# Patient Record
Sex: Male | Born: 1980 | Race: White | Hispanic: No | Marital: Married | Smoking: Former smoker
Health system: Southern US, Community
[De-identification: ages and names within clinical notes are randomized; demographics above are authoritative.]

## PROBLEM LIST (undated history)

## (undated) DIAGNOSIS — J45909 Unspecified asthma, uncomplicated: Secondary | ICD-10-CM

## (undated) HISTORY — PX: LATERAL COLLATERAL LIGAMENT REPAIR, KNEE: SHX1957

---
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
## 2003-09-30 ENCOUNTER — Emergency Department (HOSPITAL_COMMUNITY): Admission: EM | Admit: 2003-09-30 | Discharge: 2003-09-30 | Payer: Self-pay | Admitting: Emergency Medicine

## 2003-10-08 ENCOUNTER — Emergency Department (HOSPITAL_COMMUNITY): Admission: EM | Admit: 2003-10-08 | Discharge: 2003-10-08 | Payer: Self-pay | Admitting: *Deleted

## 2003-11-05 ENCOUNTER — Emergency Department (HOSPITAL_COMMUNITY): Admission: EM | Admit: 2003-11-05 | Discharge: 2003-11-05 | Payer: Self-pay | Admitting: Emergency Medicine

## 2004-05-19 ENCOUNTER — Emergency Department (HOSPITAL_COMMUNITY): Admission: EM | Admit: 2004-05-19 | Discharge: 2004-05-19 | Payer: Self-pay | Admitting: Emergency Medicine

## 2005-08-16 ENCOUNTER — Emergency Department (HOSPITAL_COMMUNITY): Admission: EM | Admit: 2005-08-16 | Discharge: 2005-08-17 | Payer: Self-pay | Admitting: Emergency Medicine

## 2005-12-14 ENCOUNTER — Emergency Department (HOSPITAL_COMMUNITY): Admission: EM | Admit: 2005-12-14 | Discharge: 2005-12-15 | Payer: Self-pay | Admitting: Emergency Medicine

## 2006-11-22 ENCOUNTER — Emergency Department (HOSPITAL_COMMUNITY): Admission: EM | Admit: 2006-11-22 | Discharge: 2006-11-22 | Payer: Self-pay | Admitting: Emergency Medicine

## 2007-03-07 ENCOUNTER — Emergency Department (HOSPITAL_COMMUNITY): Admission: EM | Admit: 2007-03-07 | Discharge: 2007-03-08 | Payer: Self-pay | Admitting: Emergency Medicine

## 2007-03-09 ENCOUNTER — Ambulatory Visit: Payer: Self-pay | Admitting: Orthopedic Surgery

## 2007-03-23 ENCOUNTER — Ambulatory Visit: Payer: Self-pay | Admitting: Orthopedic Surgery

## 2007-03-23 DIAGNOSIS — S62639A Displaced fracture of distal phalanx of unspecified finger, initial encounter for closed fracture: Secondary | ICD-10-CM | POA: Insufficient documentation

## 2007-03-23 DIAGNOSIS — J45909 Unspecified asthma, uncomplicated: Secondary | ICD-10-CM | POA: Insufficient documentation

## 2007-04-01 ENCOUNTER — Emergency Department (HOSPITAL_COMMUNITY): Admission: EM | Admit: 2007-04-01 | Discharge: 2007-04-01 | Payer: Self-pay | Admitting: Emergency Medicine

## 2007-04-06 ENCOUNTER — Ambulatory Visit: Payer: Self-pay | Admitting: Orthopedic Surgery

## 2007-06-04 ENCOUNTER — Emergency Department (HOSPITAL_COMMUNITY): Admission: EM | Admit: 2007-06-04 | Discharge: 2007-06-04 | Payer: Self-pay | Admitting: Emergency Medicine

## 2007-06-27 ENCOUNTER — Ambulatory Visit: Payer: Self-pay | Admitting: Cardiology

## 2008-04-11 ENCOUNTER — Emergency Department (HOSPITAL_COMMUNITY): Admission: EM | Admit: 2008-04-11 | Discharge: 2008-04-11 | Payer: Self-pay | Admitting: Emergency Medicine

## 2009-12-04 ENCOUNTER — Emergency Department (HOSPITAL_COMMUNITY): Admission: EM | Admit: 2009-12-04 | Discharge: 2009-12-04 | Payer: Self-pay | Admitting: Emergency Medicine

## 2011-06-11 LAB — STREP A DNA PROBE

## 2011-06-28 LAB — POCT CARDIAC MARKERS
Myoglobin, poc: 34.8
Operator id: 166561
Troponin i, poc: <0.05

## 2013-10-22 ENCOUNTER — Emergency Department (HOSPITAL_COMMUNITY): Payer: Worker's Compensation

## 2013-10-22 ENCOUNTER — Emergency Department (HOSPITAL_COMMUNITY)
Admission: EM | Admit: 2013-10-22 | Discharge: 2013-10-22 | Disposition: A | Discharge status: Home / Self Care | Payer: Worker's Compensation | Attending: Emergency Medicine | Admitting: Emergency Medicine

## 2013-10-22 ENCOUNTER — Encounter (HOSPITAL_COMMUNITY): Payer: Self-pay | Admitting: Emergency Medicine

## 2013-10-22 DIAGNOSIS — Z87891 Personal history of nicotine dependence: Secondary | ICD-10-CM | POA: Insufficient documentation

## 2013-10-22 DIAGNOSIS — T22211A Burn of second degree of right forearm, initial encounter: Secondary | ICD-10-CM

## 2013-10-22 DIAGNOSIS — Y9289 Other specified places as the place of occurrence of the external cause: Secondary | ICD-10-CM | POA: Insufficient documentation

## 2013-10-22 DIAGNOSIS — Z792 Long term (current) use of antibiotics: Secondary | ICD-10-CM | POA: Insufficient documentation

## 2013-10-22 DIAGNOSIS — J45909 Unspecified asthma, uncomplicated: Secondary | ICD-10-CM | POA: Insufficient documentation

## 2013-10-22 DIAGNOSIS — X19XXXA Contact with other heat and hot substances, initial encounter: Secondary | ICD-10-CM | POA: Insufficient documentation

## 2013-10-22 DIAGNOSIS — T22219A Burn of second degree of unspecified forearm, initial encounter: Secondary | ICD-10-CM | POA: Insufficient documentation

## 2013-10-22 DIAGNOSIS — Y9389 Activity, other specified: Secondary | ICD-10-CM | POA: Insufficient documentation

## 2013-10-22 DIAGNOSIS — Z79899 Other long term (current) drug therapy: Secondary | ICD-10-CM | POA: Insufficient documentation

## 2013-10-22 DIAGNOSIS — Y990 Civilian activity done for income or pay: Secondary | ICD-10-CM | POA: Insufficient documentation

## 2013-10-22 HISTORY — DX: Unspecified asthma, uncomplicated: J45.909

## 2013-10-22 MED ORDER — BACITRACIN ZINC 500 UNIT/GM EX OINT: 1.0000 "application " | TOPICAL_OINTMENT | Freq: Two times a day (BID) | CUTANEOUS | Status: DC

## 2013-10-22 MED ORDER — BACITRACIN ZINC 500 UNIT/GM EX OINT
1.0000 "application " | TOPICAL_OINTMENT | Freq: Two times a day (BID) | CUTANEOUS | Status: DC
  Administered 2013-10-22: 1 via TOPICAL
  Filled 2013-10-22: qty 0.9

## 2013-10-22 NOTE — ED Notes (Signed)
PA at bedside.

## 2013-10-22 NOTE — Discharge Instructions (Signed)

## 2013-10-22 NOTE — ED Notes (Signed)
Burn to right forearm. Some swelling noted to wrist and hand. Wound with surrounding erythema, no streaking noted. Wound drainage clear and minimal . No distress.

## 2013-10-22 NOTE — ED Notes (Signed)
Patient here for check of burn to right forearm. Per patient burnt arm on 10/17/13 with plastic from job. Per patient plastic approx 500 degrees F. Patient being treated at urgent care for burn, given a "shot of antibiotics" and prescription for antibiotics. Patient reports changing dressing daily. Patient sent here from urgent car for possible infection. Patient reports some swelling in right hand.

## 2013-10-22 NOTE — ED Provider Notes (Signed)
CSN: [id number]     Arrival date & time 10/22/13  0901 History   This chart was scribed for Dylan Aus, PA-C by Ladona Ridgel Day, ED scribe. This patient was seen in room APA17/APA17 and the patient's care was started at 0901.  Chief Complaint  Patient presents with  . Burn   The history is provided by the patient. No language interpreter was used.   HPI Comments: Dylan Gonzales is a 34 y.o. male who presents to the Emergency Department complaining of a burn to his right forearm which occurred 5 days ago while working w/hot plastic at work. He reports 5 days ago was tx at [facility] for burn and given IM antibiotics and rx antibiotics. Has been changing dressing daily and applying cream and was sent here from UC for possible infection; he was seen at [facility] today for recheck of his burn. Immediately applied burn gel at work at time of incident.  He reports some associated swelling/pain surrounding area of burn. He states it did not form a blister when it occurred. States he has ultram and ibuprofen at home, but has not needed to take any pain medications.  He denies fever, chills, decreased ROM,  increased redness or red streaks.    He had 1 g Rocephin IM at UC 5 days ago and was d/c w/clindamycin  [facility] is UTD PCP at [facility] [place]. Has not yet been to PCP for this problem.   Past Medical History  Diagnosis Date  . Asthma    Past Surgical History  Procedure Laterality Date  . Lateral collateral ligament repair, knee Left    History reviewed. No pertinent family history. History  Substance Use Topics  . Smoking status: Former Smoker -- 1.00 packs/day for 15 years    Types: Cigarettes    Quit date: 10/08/2013  . Smokeless tobacco: Never Used  . Alcohol Use: No    Review of Systems  Constitutional: Negative for fever, chills and fatigue.  HENT: Negative for sore throat and trouble swallowing.   Respiratory: Negative for cough, shortness of breath and wheezing.   Cardiovascular: Negative for  chest pain and palpitations.  Gastrointestinal: Negative for nausea, vomiting, abdominal pain and blood in stool.  Genitourinary: Negative for dysuria, hematuria and flank pain.  Musculoskeletal: Negative for arthralgias, back pain, myalgias, neck pain and neck stiffness.  Skin: Positive for wound (burn to his right forearm). Negative for rash.  Neurological: Negative for dizziness, weakness and numbness.  Hematological: Does not bruise/bleed easily.  All other systems reviewed and are negative.   Allergies  Other and Sulfonamide derivatives  Home Medications   Current Outpatient Rx  Name  Route  Sig  Dispense  Refill  . cefTRIAXone (ROCEPHIN) 1 G injection   Intramuscular   Inject 1 g into the muscle once.         . clindamycin (CLEOCIN) 300 MG capsule   Oral   Take 300 mg by mouth 3 (three) times daily.         . bacitracin ointment   Topical   Apply 1 application topically 2 (two) times daily.   120 g   0     Triage Vitals: BP 118/63  Pulse 101  Temp(Src) 98.1 F (36.7 C) (Oral)  Resp 18  Ht 5\' 10"  (1.778 m)  Wt 230 lb (104.327 kg)  BMI 33.00 kg/m2  SpO2 98%  Physical Exam  Nursing note and vitals reviewed. Constitutional: He is oriented to person, place, and time.  He appears well-developed and well-nourished. No distress.  HENT:  Head: Normocephalic and atraumatic.  Eyes: Conjunctivae are normal. Right eye exhibits no discharge. Left eye exhibits no discharge.  Neck: Normal range of motion.  Cardiovascular: Normal rate, regular rhythm and normal heart sounds.   No murmur heard. Pulmonary/Chest: Effort normal and breath sounds normal. No respiratory distress. He has no wheezes. He has no rales.  Musculoskeletal: Normal range of motion. He exhibits no edema.  Neurological: He is alert and oriented to person, place, and time. He has normal strength. No sensory deficit. He exhibits normal muscle tone. Coordination normal.  Skin: Skin is warm and dry.  2nd  degree burn of right forearm and right antecubital fossa  8 cm by 6 cm  Distal sensation intact, cap refill less than 2 sec Radial pulse brisk Full rom of elbow joint Compartments are soft Burn appears to be healing, no drainage, pink granulation tissue present.     ED Course  Procedures (including critical care time) DIAGNOSTIC STUDIES: Oxygen Saturation is 98% on room air, normal by my interpretation.    COORDINATION OF CARE: At 1120 AM Discussed treatment plan with patient  Labs Review Labs Reviewed - No data to display Imaging Review Dg Elbow Complete Right  10/22/2013   CLINICAL DATA:  Recent burn injury  EXAM: RIGHT ELBOW - COMPLETE 3+ VIEW  COMPARISON:  None.  FINDINGS: There is no evidence of fracture, dislocation, or joint effusion. There is no evidence of arthropathy or other focal bone abnormality. Soft tissues are unremarkable.  IMPRESSION: No acute abnormality noted.   Electronically Signed   By: Alcide CleverMark  Lukens M.D.   On: 10/22/2013 12:41    EKG Interpretation   None      MDM   1150 AM: Reports that pt had 1 g Rocephin IM at UC 5 days ago and was d/c w/clindamycin.  Burn appears to be healing well.  No significant edema, no drainage, surrounding erythema or lymphangitis to suggest infection.  granulation is present.  I will arrange pt to have f/u care with the PT dept here at the hospital with further follow-up and progress reports to be sent to his PMD.    Patient agrees to care plan and offered pain medication, but pt declined, stating he had pain medicine at home but didn't need it  Burn was bandaged with bacitracin and telfa.  Remains NV intact.  Compartments soft.    Final diagnoses:  Second degree burn of right forearm   I personally performed the services described in this documentation, which was scribed in my presence. The recorded information has been reviewed and is accurate.       Taleya Whitcher L. Trisha Mangleriplett, PA-C 10/23/13 1646

## 2013-10-22 NOTE — ED Notes (Signed)
Bacitracin applied to burn. Telfa applied after bacitracin.Pt right arm wrapped with conform dressing. Pt tolerated well.

## 2013-10-24 ENCOUNTER — Ambulatory Visit (HOSPITAL_COMMUNITY)
Admission: RE | Admit: 2013-10-24 | Discharge: 2013-10-24 | Disposition: A | Discharge status: Home / Self Care | Payer: Worker's Compensation | Source: Ambulatory Visit | Attending: Emergency Medicine | Admitting: Emergency Medicine

## 2013-10-24 DIAGNOSIS — X088XXA Exposure to other specified smoke, fire and flames, initial encounter: Secondary | ICD-10-CM | POA: Insufficient documentation

## 2013-10-24 DIAGNOSIS — J45909 Unspecified asthma, uncomplicated: Secondary | ICD-10-CM | POA: Insufficient documentation

## 2013-10-24 DIAGNOSIS — T22219A Burn of second degree of unspecified forearm, initial encounter: Secondary | ICD-10-CM | POA: Insufficient documentation

## 2013-10-24 DIAGNOSIS — IMO0002 Reserved for concepts with insufficient information to code with codable children: Secondary | ICD-10-CM | POA: Insufficient documentation

## 2013-10-24 DIAGNOSIS — IMO0001 Reserved for inherently not codable concepts without codable children: Secondary | ICD-10-CM | POA: Insufficient documentation

## 2013-10-24 NOTE — ED Provider Notes (Signed)
Medical screening examination/treatment/procedure(s) were performed by non-physician practitioner and as supervising physician I was immediately available for consultation/collaboration.  EKG Interpretation   None         Emerie Vanderkolk M Shelley Pooley, DO 10/24/13 1941 

## 2013-10-24 NOTE — Evaluation (Signed)
Physical Therapy Evaluation  Patient Details  Name: Dylan Gonzales MRN: [id number] Date of Birth: 03/07/1981  Today's Date: 10/24/2013 Time: 1345-1415 PT Time Calculation (min): 30 min              Visit#: 1 of 8  Re-eval: 11/23/13 Assessment Diagnosis: Rt forearm second degree burn Prior Therapy: none  Authorization: work comp    Authorization Time Period:    Authorization Visit#:   of     Past Medical History:  Past Medical History  Diagnosis Date  . Asthma    Past Surgical History:  Past Surgical History  Procedure Laterality Date  . Lateral collateral ligament repair, knee Left     Subjective Symptoms/Limitations Symptoms: Mr. Dylan Gonzales states that he burned his arm at work on [employer]. The incident occured on2/12/2013.  Mr. Dylan Gonzales states as long as the dressing is on his burn is not too painful but once the wrapping comes off there is a throbbing.  The patient is currently working light duty.   Pain Assessment Currently in Pain?: Yes Pain Score: 2  (went up to a 7-8 with debridement) Pain Location: Arm Pain Orientation: Right Pain Type: Acute pain Pain Frequency: Intermittent Pain Relieving Factors: pressure dressing Effect of Pain on Daily Activities: increases     Assessment RUE Assessment RUE Assessment:  (normal ROM for Rt elbow.)  Pt exhibits a second degree burn on the volar aspect of his Rt forearm.  Superiorly the wound measures 7 cm x 7 cm with no depth; inferiorly wound is 7 cm x 3 cm. Wound is 80% granulated with 20% slough.  Wound was bladed with a scapel followed by honey, xeroform, 4x4; 3" kling coban and netting.  Physical Therapy Assessment and Plan PT Assessment and Plan Clinical Impression Statement: Pt referred for wound care.  PT has a second degree burn that occurred on 10/17/2012.  Wound has 20% adherent thin eschar and will benefit from skilled therapy to promote 100% granulation.  Pt was instructed in ROM exercises to ensure skin is healing  with full range. Pt will benefit from skilled therapeutic intervention in order to improve on the following deficits: Pain;Other (comment) (burn) Rehab Potential: Good PT Frequency: Min 2X/week PT Duration: 4 weeks (anticipate burn will be healed prior to four weeks.) PT Plan: see pt twice a week until burn is healed (no more than 4 weeks)    Goals PT Short Term Goals Time to Complete Short Term Goals: 2 weeks PT Short Term Goal 1: burn to be healed  Problem List Patient Active Problem List   Diagnosis Date Noted  . Second degree burn 10/24/2013  . ASTHMA 03/23/2007  . FX CLOSED PHALANX, HAND, DISTAL 03/23/2007    PT Plan of Care PT Home Exercise Plan: made but pt left prior to getting  GP    Dylan Gonzales,Dylan Gonzales 10/24/2013, 2:29 PM  Physician Documentation Your signature is required to indicate approval of the treatment plan as stated above.  Please sign and either send electronically or make a copy of this report for your files and return this physician signed original.   Please mark one 1.__approve of plan  2. ___approve of plan with the following conditions.   ______________________________  _____________________ Physician Signature                                                                                                             Date

## 2013-10-26 ENCOUNTER — Ambulatory Visit (HOSPITAL_COMMUNITY)
Admission: RE | Admit: 2013-10-26 | Discharge: 2013-10-26 | Disposition: A | Discharge status: Home / Self Care | Payer: Worker's Compensation | Source: Ambulatory Visit | Attending: Family Medicine | Admitting: Family Medicine

## 2013-10-26 DIAGNOSIS — T22219A Burn of second degree of unspecified forearm, initial encounter: Secondary | ICD-10-CM | POA: Insufficient documentation

## 2013-10-26 DIAGNOSIS — Y9269 Other specified industrial and construction area as the place of occurrence of the external cause: Secondary | ICD-10-CM | POA: Insufficient documentation

## 2013-10-26 DIAGNOSIS — IMO0002 Reserved for concepts with insufficient information to code with codable children: Secondary | ICD-10-CM

## 2013-10-26 DIAGNOSIS — IMO0001 Reserved for inherently not codable concepts without codable children: Secondary | ICD-10-CM | POA: Insufficient documentation

## 2013-10-26 DIAGNOSIS — X19XXXA Contact with other heat and hot substances, initial encounter: Secondary | ICD-10-CM | POA: Insufficient documentation

## 2013-10-26 NOTE — Progress Notes (Signed)
Physical Therapy - Wound Therapy  Treatment   Patient Details  Name: Dylan Gonzales [place] MRN: [id number] Date of Birth: 08/07/1981  Today's Date: 10/26/2013 Time: 1105-1140 Time Calculation (min): 35 min Charge ; selective debridement <20 cm  Visit#: 2 of 8  Re-eval: 11/23/13   Physical Therapy Wound Care Treatment  Referring physician/Next Apt:  Medical Diagnosis: second degree burn on volar aspect of Rt forearm Subjective Subjective Assessment Subjective: Pt stated increased forearm pain with elbow flexion, minimal pain with arm straight Pain Assessment  Pain Assessment  Pain Score: 5   Pain Location: Arm  Pain Orientation: Right (with flexion) Objective:  Wound 1  Location:  Rt volar ascept of Rt forearm     Length: 7cm on 10/24/2013 Depth: 7cm on 10/24/2013 Width: no depth Granulation:  85%  Slough: 15% Drainage (amount/description): none Periwound: intact Other:    Wound 2  Location:  Rt volar ascept of Rt forearm     Length: 7cm  on 10/24/2013 Depth: 3cm on 10/24/2013 Width: no depth Granulation:  90% Slough: 10% Drainage (amount/description): none Periwound: Intact Other:    Dressing Type: Honey, xeroform, 4x4 gauze, coban, and netting Sharps Used/Location: scalpel Tissue Remove: adherent thin eschar and deadskin perimeter of wound Physical Therapy Assessment and Plan Wound Therapy - Assess/Plan/Recommendations Wound Therapy - Clinical Statement: Removal of thin eschar and dead skin wound perimeter to promote healing;  Continued wtih honey, xeroform, gauze with compression.  Pt instructed elbow strengthening and RIOM exercises and able to demosntrate appropriate technique with all exercises.   Wound Plan: Continue with current POC, F/u with HEP      Goals    Problem List Patient Active Problem List   Diagnosis Date Noted  . Second degree burn 10/24/2013  . ASTHMA 03/23/2007  . FX CLOSED PHALANX, HAND, DISTAL 03/23/2007    GP    Juel Burrowockerham, Naryiah Schley  Jo 10/26/2013, 1:38 PM

## 2013-10-30 ENCOUNTER — Ambulatory Visit (HOSPITAL_COMMUNITY): Payer: Self-pay | Admitting: Physical Therapy

## 2013-10-31 ENCOUNTER — Ambulatory Visit (HOSPITAL_COMMUNITY)
Admission: RE | Admit: 2013-10-31 | Discharge: 2013-10-31 | Disposition: A | Discharge status: Home / Self Care | Payer: Worker's Compensation | Source: Ambulatory Visit | Attending: Family Medicine | Admitting: Family Medicine

## 2013-10-31 DIAGNOSIS — IMO0002 Reserved for concepts with insufficient information to code with codable children: Secondary | ICD-10-CM

## 2013-10-31 NOTE — Progress Notes (Signed)
Physical Therapy - Wound Therapy  Treatment   Patient Details  Name: Dylan Gonzales [place] MRN: [id number] Date of Birth: 02/20/1981  Today's Date: 10/31/2013 Time: 1015-1040 Time Calculation (min): 25 min Charge: selective debridement <20cm  Visit#: 3 of 8  Re-eval: 11/23/13  Subjective Subjective Assessment Subjective: Intermittent pain with elbow flexion, mainly tenderness  Pain Assessment Pain Assessment Pain Score: 5  Pain Location: Arm Pain Orientation: Right   Physical Therapy Wound Care Treatment  Objective:  Wound 1   Location: Rt volar ascept of Rt forearm  Length: 7cm on 10/24/2013 Depth: 3cm on 10/24/2013 Width: no depth Granulation: 90% Slough: 10% Drainage (amount/description): none Periwound: Intact Other:  Dressing Type: Xeroform, 4x4 gauze, coban, and netting Sharps Used/Location: scalpel Tissue Remove: adherent thin eschar and deadskin perimeter of wound      Physical Therapy Assessment and Plan Wound Therapy - Assess/Plan/Recommendations Wound Therapy - Clinical Statement: Continued debridement for removal of thin eschar and dead skin wound perimeter to promote healing.  Xeroform, gauze and compression dressings.  Pt reported compliance with HEP without difficulty. Wound Plan: Continue with current POC,       Goals    Problem List Patient Active Problem List   Diagnosis Date Noted  . Second degree burn 10/24/2013  . ASTHMA 03/23/2007  . FX CLOSED PHALANX, HAND, DISTAL 03/23/2007    GP    Juel Burrowockerham, Jaliyah Fotheringham Jo 10/31/2013, 11:01 AM

## 2013-11-01 ENCOUNTER — Ambulatory Visit (HOSPITAL_COMMUNITY): Payer: Self-pay | Admitting: Physical Therapy

## 2013-11-02 ENCOUNTER — Ambulatory Visit (HOSPITAL_COMMUNITY)
Admission: RE | Admit: 2013-11-02 | Discharge: 2013-11-02 | Disposition: A | Discharge status: Home / Self Care | Payer: Worker's Compensation | Source: Ambulatory Visit | Attending: Family Medicine | Admitting: Family Medicine

## 2013-11-02 DIAGNOSIS — IMO0002 Reserved for concepts with insufficient information to code with codable children: Secondary | ICD-10-CM

## 2013-11-02 NOTE — Progress Notes (Signed)
Physical Therapy Treatment Patient Details  Name: Dylan Gonzales MRN: [id number] Date of Birth: 30-Jan-1981  Today's Date: 11/02/2013 Time: 1015-1035 PT Time Calculation (min): 20 min  Visit#: 4 of 4  Re-eval: 11/02/13    Authorization: work comp  Authorization Time Period:    Authorization Visit#:   of     Subjective: Symptoms/Limitations Symptoms: Pt states he is doing better but the burn site is still tender Pain Assessment Currently in Pain?: Yes Pain Score: 3  Pain Location: Arm Pain Orientation: Right   Remaining burn was cleansed.  PT was educated that the new skin that has emerged from the burn will be like babies skin and will be very susceptible to drying out as well as burning.  Urged pt to use 50+ sunscreen on burn when he is going to be out and to keep moisturized.    Physical Therapy Assessment and Plan PT Assessment and Plan Clinical Impression Statement: Burn is healed except for a small area .5 cm diameter which had honey and bandaid applied.  Pt has full ROM and is no longer in need of skilled care. PT Plan: D/C patient as burn is healed.    Goals PT Short Term Goals PT Short Term Goal 1 - Progress: Met  Problem List Patient Active Problem List   Diagnosis Date Noted  . Second degree burn 10/24/2013  . ASTHMA 03/23/2007  . FX CLOSED PHALANX, HAND, DISTAL 03/23/2007       GP    Jerika Wales,CINDY 11/02/2013, 1:41 PM

## 2014-02-10 IMAGING — CR DG ELBOW COMPLETE 3+V*R*
4 series · 4 of 4 positions shown · non-contrast
Comparison: None.

CLINICAL DATA: Recent burn injury

EXAM:
RIGHT ELBOW - COMPLETE 3+ VIEW

[view not recorded (1 of 4)]
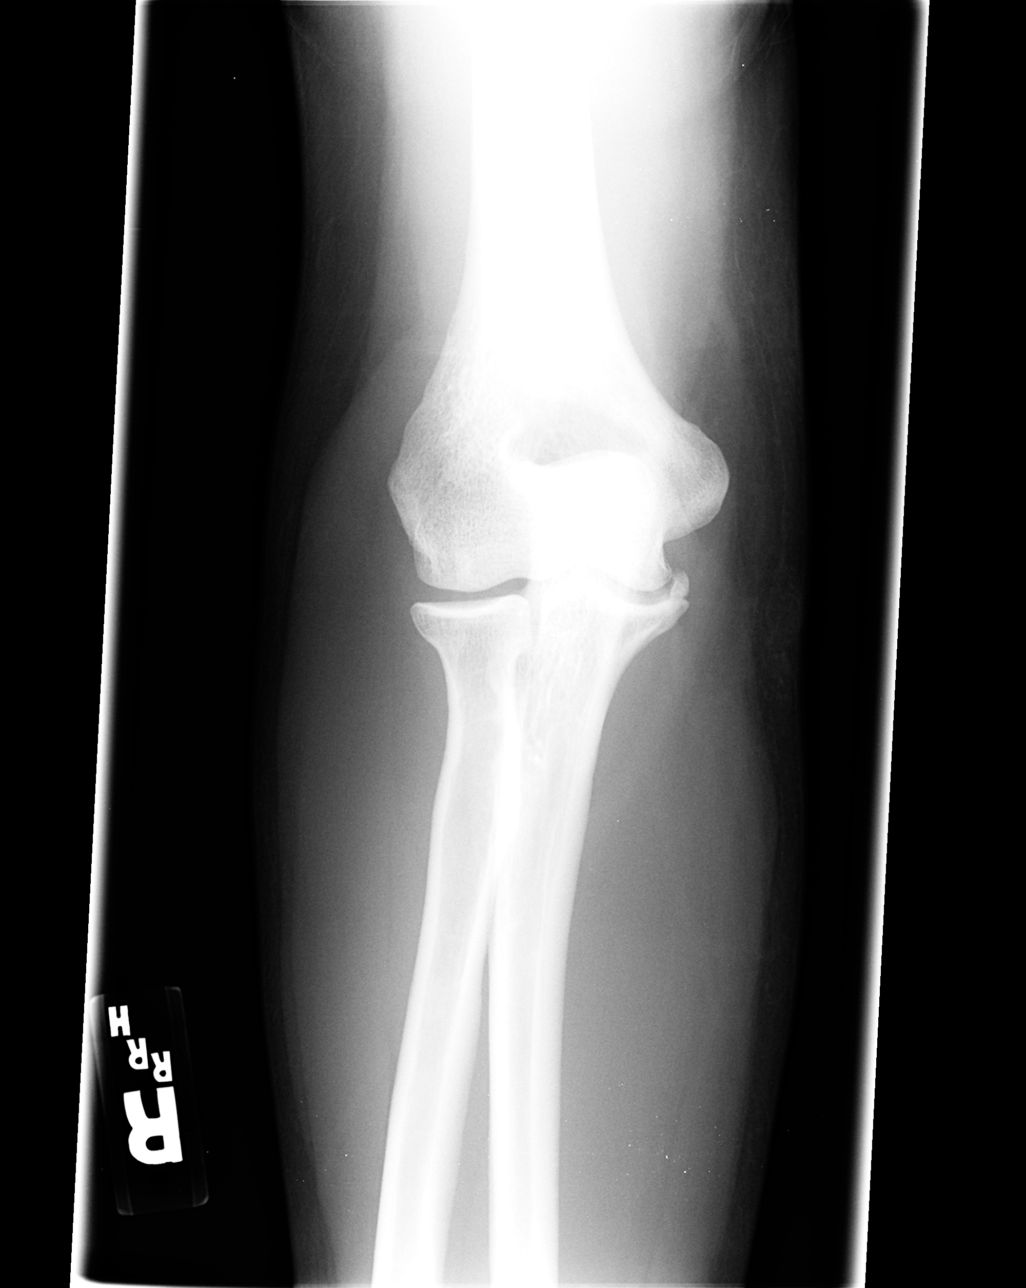

[view not recorded (2 of 4)]
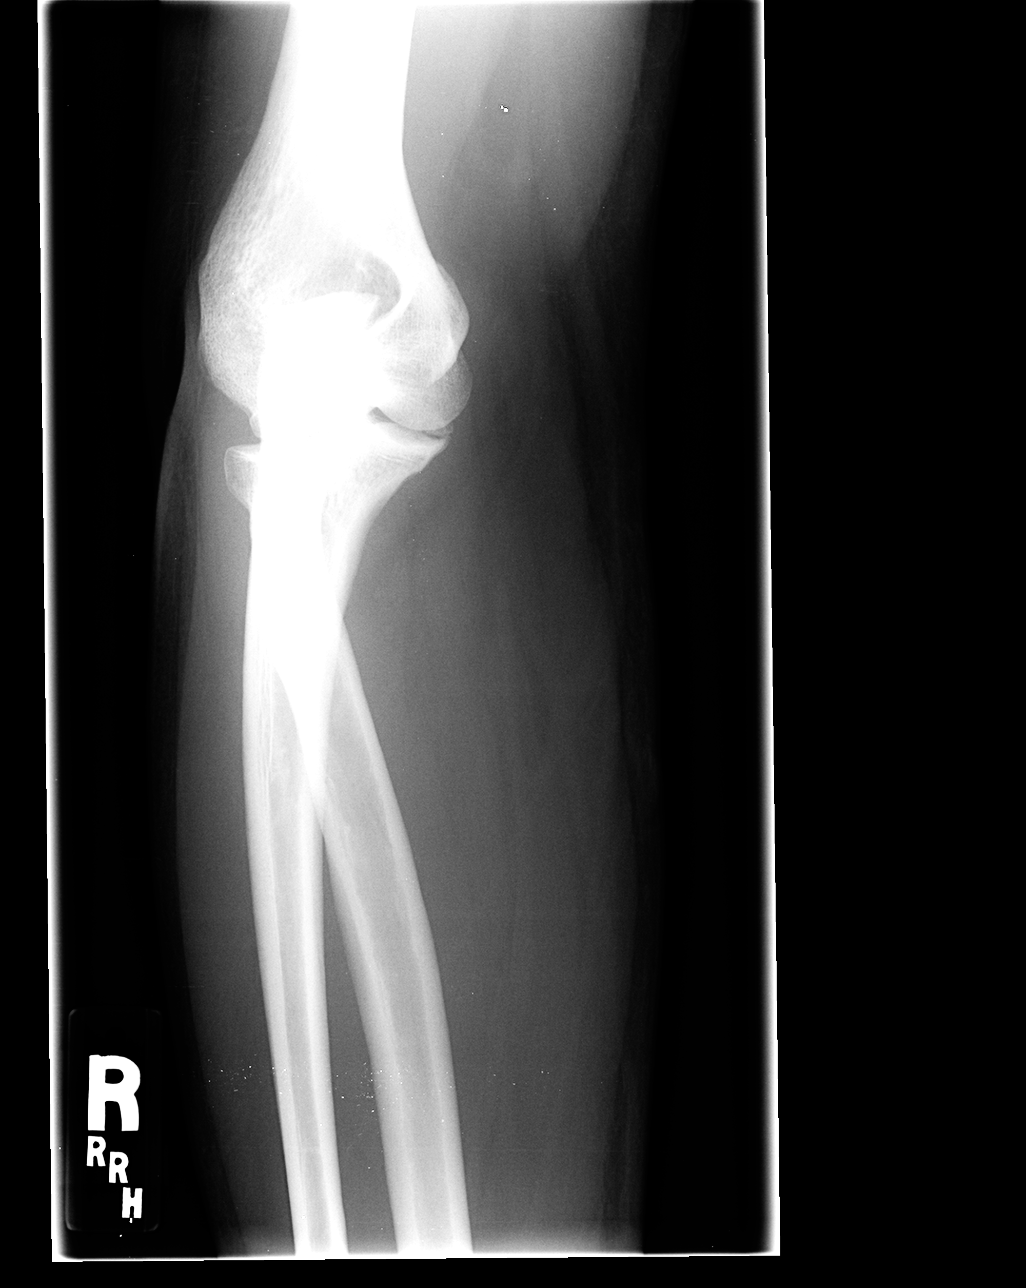

[view not recorded (3 of 4)]
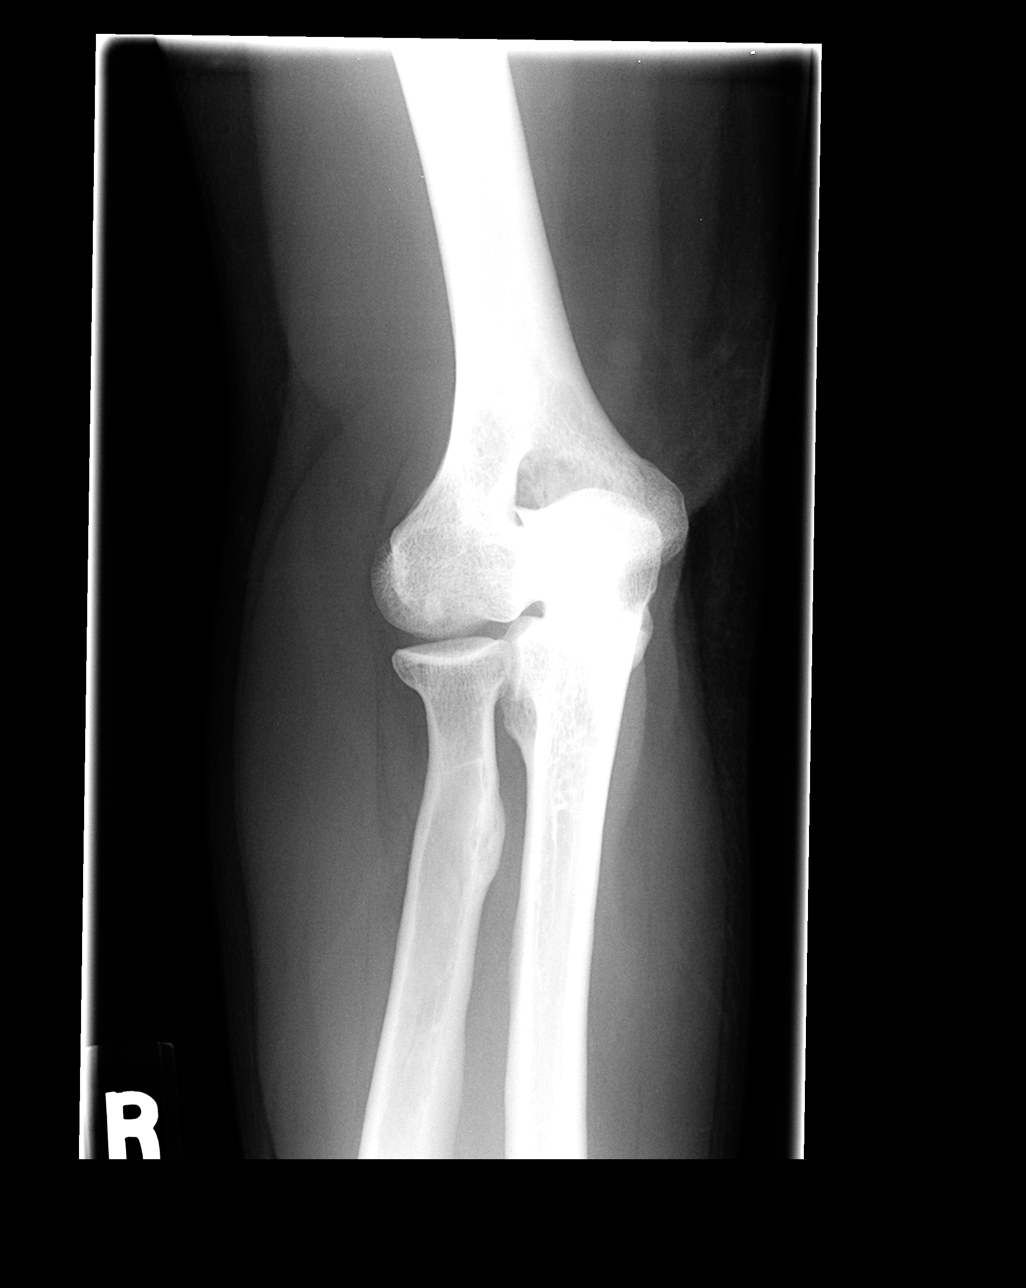

[view not recorded (4 of 4)]
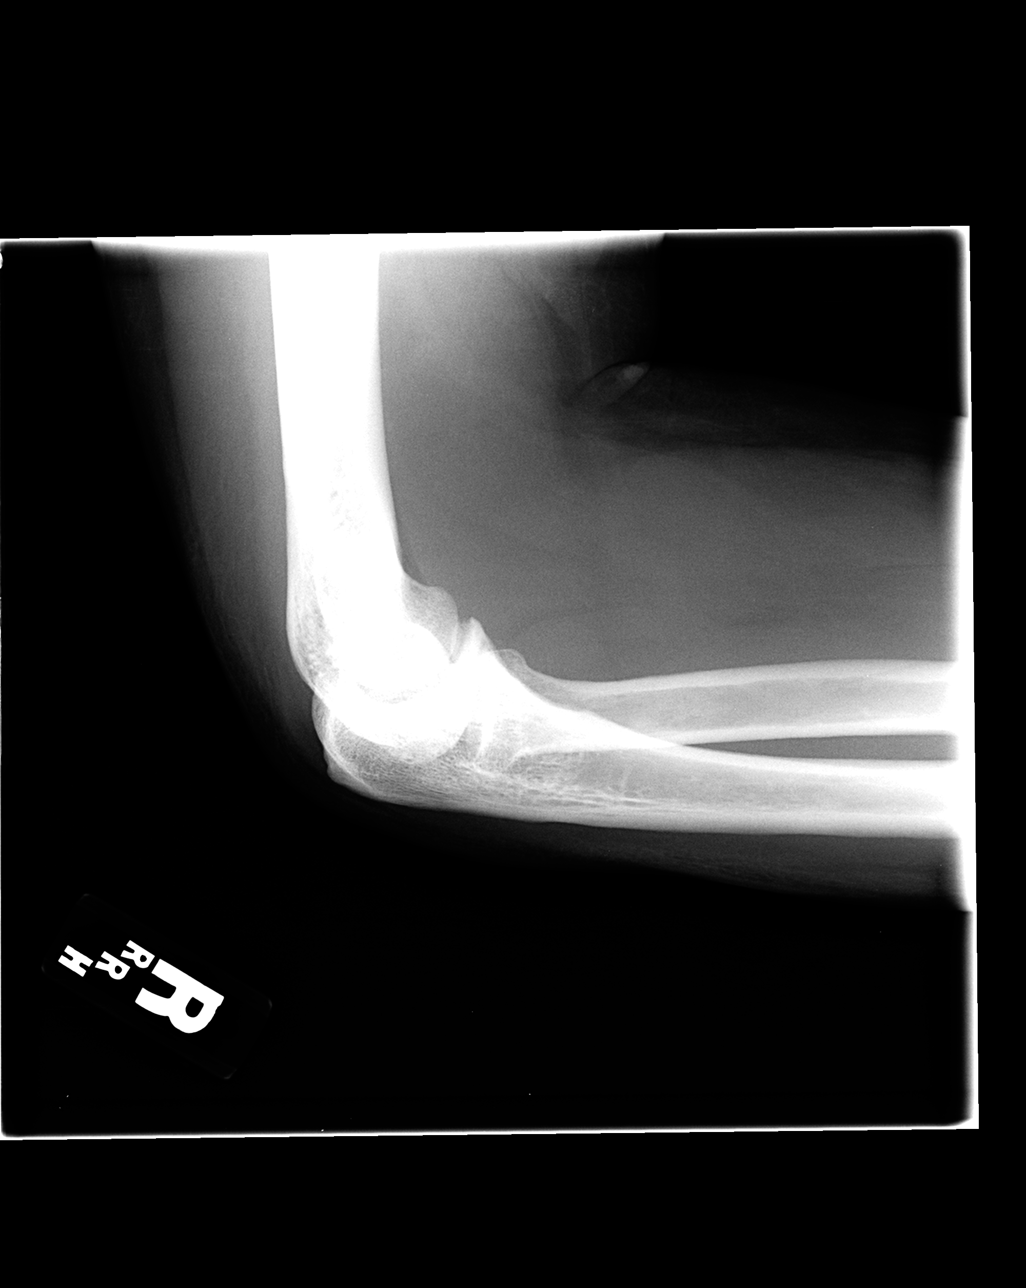

[4 of 4 positions shown; findings below may reference images not displayed]

FINDINGS: There is no evidence of fracture, dislocation, or joint effusion.
There is no evidence of arthropathy or other focal bone abnormality.
Soft tissues are unremarkable.
IMPRESSION: No acute abnormality noted.

## 2016-09-23 DIAGNOSIS — G4733 Obstructive sleep apnea (adult) (pediatric): Secondary | ICD-10-CM | POA: Diagnosis not present

## 2016-09-23 DIAGNOSIS — Z6841 Body Mass Index (BMI) 40.0 and over, adult: Secondary | ICD-10-CM | POA: Diagnosis not present

## 2016-09-23 DIAGNOSIS — R0683 Snoring: Secondary | ICD-10-CM | POA: Diagnosis not present

## 2016-09-23 DIAGNOSIS — R5383 Other fatigue: Secondary | ICD-10-CM | POA: Diagnosis not present

## 2016-10-14 DIAGNOSIS — G4733 Obstructive sleep apnea (adult) (pediatric): Secondary | ICD-10-CM | POA: Diagnosis not present

## 2016-10-14 DIAGNOSIS — R0683 Snoring: Secondary | ICD-10-CM | POA: Diagnosis not present

## 2016-10-14 DIAGNOSIS — R5383 Other fatigue: Secondary | ICD-10-CM | POA: Diagnosis not present

## 2016-11-17 DIAGNOSIS — G4733 Obstructive sleep apnea (adult) (pediatric): Secondary | ICD-10-CM | POA: Diagnosis not present

## 2016-12-18 DIAGNOSIS — G4733 Obstructive sleep apnea (adult) (pediatric): Secondary | ICD-10-CM | POA: Diagnosis not present

## 2016-12-20 DIAGNOSIS — G4733 Obstructive sleep apnea (adult) (pediatric): Secondary | ICD-10-CM | POA: Diagnosis not present

## 2017-01-17 DIAGNOSIS — G4733 Obstructive sleep apnea (adult) (pediatric): Secondary | ICD-10-CM | POA: Diagnosis not present

## 2017-01-28 DIAGNOSIS — Z6841 Body Mass Index (BMI) 40.0 and over, adult: Secondary | ICD-10-CM | POA: Diagnosis not present

## 2017-01-28 DIAGNOSIS — R05 Cough: Secondary | ICD-10-CM | POA: Diagnosis not present

## 2017-01-28 DIAGNOSIS — J4521 Mild intermittent asthma with (acute) exacerbation: Secondary | ICD-10-CM | POA: Diagnosis not present

## 2017-02-17 DIAGNOSIS — G4733 Obstructive sleep apnea (adult) (pediatric): Secondary | ICD-10-CM | POA: Diagnosis not present

## 2017-04-19 DIAGNOSIS — G4733 Obstructive sleep apnea (adult) (pediatric): Secondary | ICD-10-CM | POA: Diagnosis not present

## 2017-05-20 DIAGNOSIS — G4733 Obstructive sleep apnea (adult) (pediatric): Secondary | ICD-10-CM | POA: Diagnosis not present

## 2017-05-31 DIAGNOSIS — G4733 Obstructive sleep apnea (adult) (pediatric): Secondary | ICD-10-CM | POA: Diagnosis not present

## 2017-06-01 DIAGNOSIS — G4733 Obstructive sleep apnea (adult) (pediatric): Secondary | ICD-10-CM | POA: Diagnosis not present

## 2017-06-19 DIAGNOSIS — G4733 Obstructive sleep apnea (adult) (pediatric): Secondary | ICD-10-CM | POA: Diagnosis not present

## 2017-07-20 DIAGNOSIS — G4733 Obstructive sleep apnea (adult) (pediatric): Secondary | ICD-10-CM | POA: Diagnosis not present

## 2017-08-19 DIAGNOSIS — G4733 Obstructive sleep apnea (adult) (pediatric): Secondary | ICD-10-CM | POA: Diagnosis not present

## 2017-11-02 DIAGNOSIS — M62830 Muscle spasm of back: Secondary | ICD-10-CM | POA: Diagnosis not present

## 2017-11-02 DIAGNOSIS — M546 Pain in thoracic spine: Secondary | ICD-10-CM | POA: Diagnosis not present

## 2017-11-02 DIAGNOSIS — Z6841 Body Mass Index (BMI) 40.0 and over, adult: Secondary | ICD-10-CM | POA: Diagnosis not present

## 2017-11-02 DIAGNOSIS — G43909 Migraine, unspecified, not intractable, without status migrainosus: Secondary | ICD-10-CM | POA: Diagnosis not present

## 2017-11-24 ENCOUNTER — Emergency Department (HOSPITAL_COMMUNITY): Payer: BLUE CROSS/BLUE SHIELD

## 2017-11-24 ENCOUNTER — Emergency Department (HOSPITAL_COMMUNITY)
Admission: EM | Admit: 2017-11-24 | Discharge: 2017-11-24 | Disposition: A | Discharge status: Home / Self Care | Payer: BLUE CROSS/BLUE SHIELD | Attending: Emergency Medicine | Admitting: Emergency Medicine

## 2017-11-24 DIAGNOSIS — R079 Chest pain, unspecified: Secondary | ICD-10-CM | POA: Diagnosis not present

## 2017-11-24 DIAGNOSIS — J45909 Unspecified asthma, uncomplicated: Secondary | ICD-10-CM | POA: Insufficient documentation

## 2017-11-24 DIAGNOSIS — Z79899 Other long term (current) drug therapy: Secondary | ICD-10-CM | POA: Insufficient documentation

## 2017-11-24 DIAGNOSIS — Z87891 Personal history of nicotine dependence: Secondary | ICD-10-CM | POA: Diagnosis not present

## 2017-11-24 DIAGNOSIS — R0789 Other chest pain: Secondary | ICD-10-CM | POA: Diagnosis not present

## 2017-11-24 LAB — CBC WITH DIFFERENTIAL/PLATELET
Basophils Absolute: 0.1 10*3/uL (ref 0.0–0.1)
Basophils Relative: 1 %
Eosinophils Absolute: 0.4 10*3/uL (ref 0.0–0.7)
Eosinophils Relative: 5 %
HCT: 43.3 % (ref 39.0–52.0)
HEMOGLOBIN: 14 g/dL (ref 13.0–17.0)
LYMPHS ABS: 1.7 10*3/uL (ref 0.7–4.0)
LYMPHS PCT: 23 %
MCH: 29.6 pg (ref 26.0–34.0)
MCHC: 32.3 g/dL (ref 30.0–36.0)
MCV: 91.5 fL (ref 78.0–100.0)
MONOS PCT: 8 %
Monocytes Absolute: 0.5 10*3/uL (ref 0.1–1.0)
NEUTROS PCT: 63 %
Neutro Abs: 4.5 10*3/uL (ref 1.7–7.7)
Platelets: 243 10*3/uL (ref 150–400)
RBC: 4.73 MIL/uL (ref 4.22–5.81)
RDW: 13 % (ref 11.5–15.5)
WBC: 7.1 10*3/uL (ref 4.0–10.5)

## 2017-11-24 LAB — I-STAT TROPONIN, ED
Troponin i, poc: 0 ng/mL (ref 0.00–0.08)
Troponin i, poc: 0 ng/mL (ref 0.00–0.08)

## 2017-11-24 LAB — COMPREHENSIVE METABOLIC PANEL
ALK PHOS: 106 U/L (ref 38–126)
ALT: 41 U/L (ref 17–63)
ANION GAP: 11 (ref 5–15)
AST: 23 U/L (ref 15–41)
Albumin: 4 g/dL (ref 3.5–5.0)
BUN: 19 mg/dL (ref 6–20)
CALCIUM: 9.5 mg/dL (ref 8.9–10.3)
CO2: 25 mmol/L (ref 22–32)
CREATININE: 0.79 mg/dL (ref 0.61–1.24)
Chloride: 105 mmol/L (ref 101–111)
Glucose, Bld: 100 mg/dL — ABNORMAL HIGH (ref 65–99)
Potassium: 4.2 mmol/L (ref 3.5–5.1)
Sodium: 141 mmol/L (ref 135–145)
Total Bilirubin: 0.8 mg/dL (ref 0.3–1.2)
Total Protein: 7.4 g/dL (ref 6.5–8.1)

## 2017-11-24 MED ORDER — MORPHINE SULFATE (PF) 4 MG/ML IV SOLN: 4.0000 mg | Freq: Once | INTRAVENOUS | Status: DC

## 2017-11-24 MED ORDER — PANTOPRAZOLE SODIUM 40 MG IV SOLR
40.0000 mg | Freq: Once | INTRAVENOUS | Status: AC
  Administered 2017-11-24: 40 mg via INTRAVENOUS
  Filled 2017-11-24: qty 40

## 2017-11-24 MED ORDER — PANTOPRAZOLE SODIUM 20 MG PO TBEC: 20.0000 mg | DELAYED_RELEASE_TABLET | Freq: Every day | ORAL | 0 refills | Status: AC

## 2017-11-24 NOTE — ED Triage Notes (Signed)
Pt reports intermittent chest pain for several days, worse at times, 3/10 at this time, no other assoc symptoms.  Pt reports he had some muscle strain and tightness to his left lateral chest diagnosed by his pmd, taking ibuprofen for same.

## 2017-11-24 NOTE — ED Notes (Signed)
EKG given and seen by Dr Rancour 

## 2017-11-24 NOTE — ED Provider Notes (Signed)
[facility] Provider Note   CSN: [id number] Arrival date & time: 11/24/17  [id number]     History   Chief Complaint Chief Complaint  Patient presents with  . Chest Pain    HPI Dylan Gonzales is a 37 y.o. male.  Patient states that he has been having some burning in his chest last couple days.  Patient has no history of any medical problems but he does smoke   The history is provided by the patient.  Chest Pain   This is a new problem. The current episode started 12 to 24 hours ago. The problem occurs constantly. The problem has not changed since onset.Associated with: Unknown. The pain is present in the substernal region. The pain is at a severity of 3/10. The pain is mild. The quality of the pain is described as burning. The pain does not radiate. Pertinent negatives include no abdominal pain, no back pain, no cough and no headaches.  Pertinent negatives for past medical history include no seizures.    Past Medical History:  Diagnosis Date  . Asthma     Patient Active Problem List   Diagnosis Date Noted  . Second degree burn 10/24/2013  . ASTHMA 03/23/2007  . FX CLOSED PHALANX, HAND, DISTAL 03/23/2007    Past Surgical History:  Procedure Laterality Date  . LATERAL COLLATERAL LIGAMENT REPAIR, KNEE Left        Home Medications    Prior to Admission medications   Medication Sig Start Date End Date Taking? Authorizing Provider  albuterol (PROVENTIL HFA;VENTOLIN HFA) 108 (90 Base) MCG/ACT inhaler Inhale 1-2 puffs into the lungs every 6 (six) hours as needed for wheezing or shortness of breath.   Yes [provider]  ibuprofen (ADVIL,MOTRIN) 800 MG tablet Take 1 tablet by mouth 2 (two) times daily as needed for pain. 11/02/17  Yes [provider]  SUMAtriptan (IMITREX) 100 MG tablet Take 1 tablet by mouth daily as needed for migraine. 11/02/17  Yes [provider]  pantoprazole (PROTONIX) 20 MG tablet Take 1 tablet (20 mg  total) by mouth daily. 11/24/17   Bethann BerkshireZammit, Jermiyah Ricotta, MD    Family History No family history on file.  Social History Social History   Tobacco Use  . Smoking status: Former Smoker    Packs/day: 1.00    Years: 15.00    Pack years: 15.00    Types: Cigarettes    Last attempt to quit: 10/08/2013    Years since quitting: 4.1  . Smokeless tobacco: Never Used  Substance Use Topics  . Alcohol use: No  . Drug use: No     Allergies   Other and Sulfonamide derivatives   Review of Systems Review of Systems  Constitutional: Negative for appetite change and fatigue.  HENT: Negative for congestion, ear discharge and sinus pressure.   Eyes: Negative for discharge.  Respiratory: Negative for cough.   Cardiovascular: Positive for chest pain.  Gastrointestinal: Negative for abdominal pain and diarrhea.  Genitourinary: Negative for frequency and hematuria.  Musculoskeletal: Negative for back pain.  Skin: Negative for rash.  Neurological: Negative for seizures and headaches.  Psychiatric/Behavioral: Negative for hallucinations.     Physical Exam Updated Vital Signs BP 119/73   Pulse 78   Temp 97.8 F (36.6 C) (Oral)   Resp 17   Ht 5\' 10"  (1.778 m)   Wt 127 kg (280 lb)   SpO2 97%   BMI 40.18 kg/m   Physical Exam  Constitutional: He is oriented  to person, place, and time. He appears well-developed.  HENT:  Head: Normocephalic.  Eyes: Conjunctivae and EOM are normal. No scleral icterus.  Neck: Neck supple. No thyromegaly present.  Cardiovascular: Normal rate and regular rhythm. Exam reveals no gallop and no friction rub.  No murmur heard. Pulmonary/Chest: No stridor. He has no wheezes. He has no rales. He exhibits no tenderness.  Abdominal: He exhibits no distension. There is no tenderness. There is no rebound.  Musculoskeletal: Normal range of motion. He exhibits no edema.  Lymphadenopathy:    He has no cervical adenopathy.  Neurological: He is oriented to person, place, and  time. He exhibits normal muscle tone. Coordination normal.  Skin: No rash noted. No erythema.  Psychiatric: He has a normal mood and affect. His behavior is normal.     ED Treatments / Results  Labs (all labs ordered are listed, but only abnormal results are displayed) Labs Reviewed  COMPREHENSIVE METABOLIC PANEL - Abnormal; Notable for the following components:      Result Value   Glucose, Bld 100 (*)    All other components within normal limits  CBC WITH DIFFERENTIAL/PLATELET  I-STAT TROPONIN, ED  I-STAT TROPONIN, ED    EKG  EKG Interpretation  Date/Time:  Thursday November 24 2017 06:42:31 EDT Ventricular Rate:  82 PR Interval:    QRS Duration: 96 QT Interval:  364 QTC Calculation: 426 R Axis:   -10 Text Interpretation:  Sinus rhythm Confirmed by Bethann Berkshire [phone]) on 11/24/2017 11:35:12 AM       Radiology Dg Chest 2 View  Result Date: 11/24/2017 CLINICAL DATA:  Left-sided chest pain for several days. EXAM: CHEST - 2 VIEW COMPARISON:  01/28/2017 FINDINGS: The heart size and mediastinal contours are within normal limits. Both lungs are clear. The visualized skeletal structures are unremarkable. IMPRESSION: Negative.  No active cardiopulmonary disease. Electronically Signed   By: Myles Rosenthal M.D.   On: 11/24/2017 08:31    Procedures Procedures (including critical care time)  Medications Ordered in ED Medications  morphine 4 MG/ML injection 4 mg (4 mg Intravenous Refused 11/24/17 0831)  pantoprazole (PROTONIX) injection 40 mg (40 mg Intravenous Given 11/24/17 0854)     Initial Impression / Assessment and Plan / ED Course  I have reviewed the triage vital signs and the nursing notes.  Pertinent labs & imaging results that were available during my care of the patient were reviewed by me and considered in my medical decision making (see chart for details).     Labs including CBC chemistries troponin x2 are all unremarkable.  EKG normal.  Chest x-ray normal.  Doubt  patient has any coronary artery disease.  Patient has some family history but not for young age.  Patient is put on Protonix and referred to his family doctor and also given a cardiology referral  Final Clinical Impressions(s) / ED Diagnoses   Final diagnoses:  Atypical chest pain    ED Discharge Orders        Ordered    pantoprazole (PROTONIX) 20 MG tablet  Daily     11/24/17 1147       Bethann Berkshire, MD 11/24/17 1155

## 2017-11-24 NOTE — Discharge Instructions (Signed)
Follow-up with your family doctor next week for recheck.  You can also follow-up with cardiology

## 2018-03-15 IMAGING — DX DG CHEST 2V
2 series · 2 of 2 positions shown · non-contrast
Comparison: 01/28/2017

CLINICAL DATA: Left-sided chest pain for several days.

EXAM:
CHEST - 2 VIEW

[chest pa]
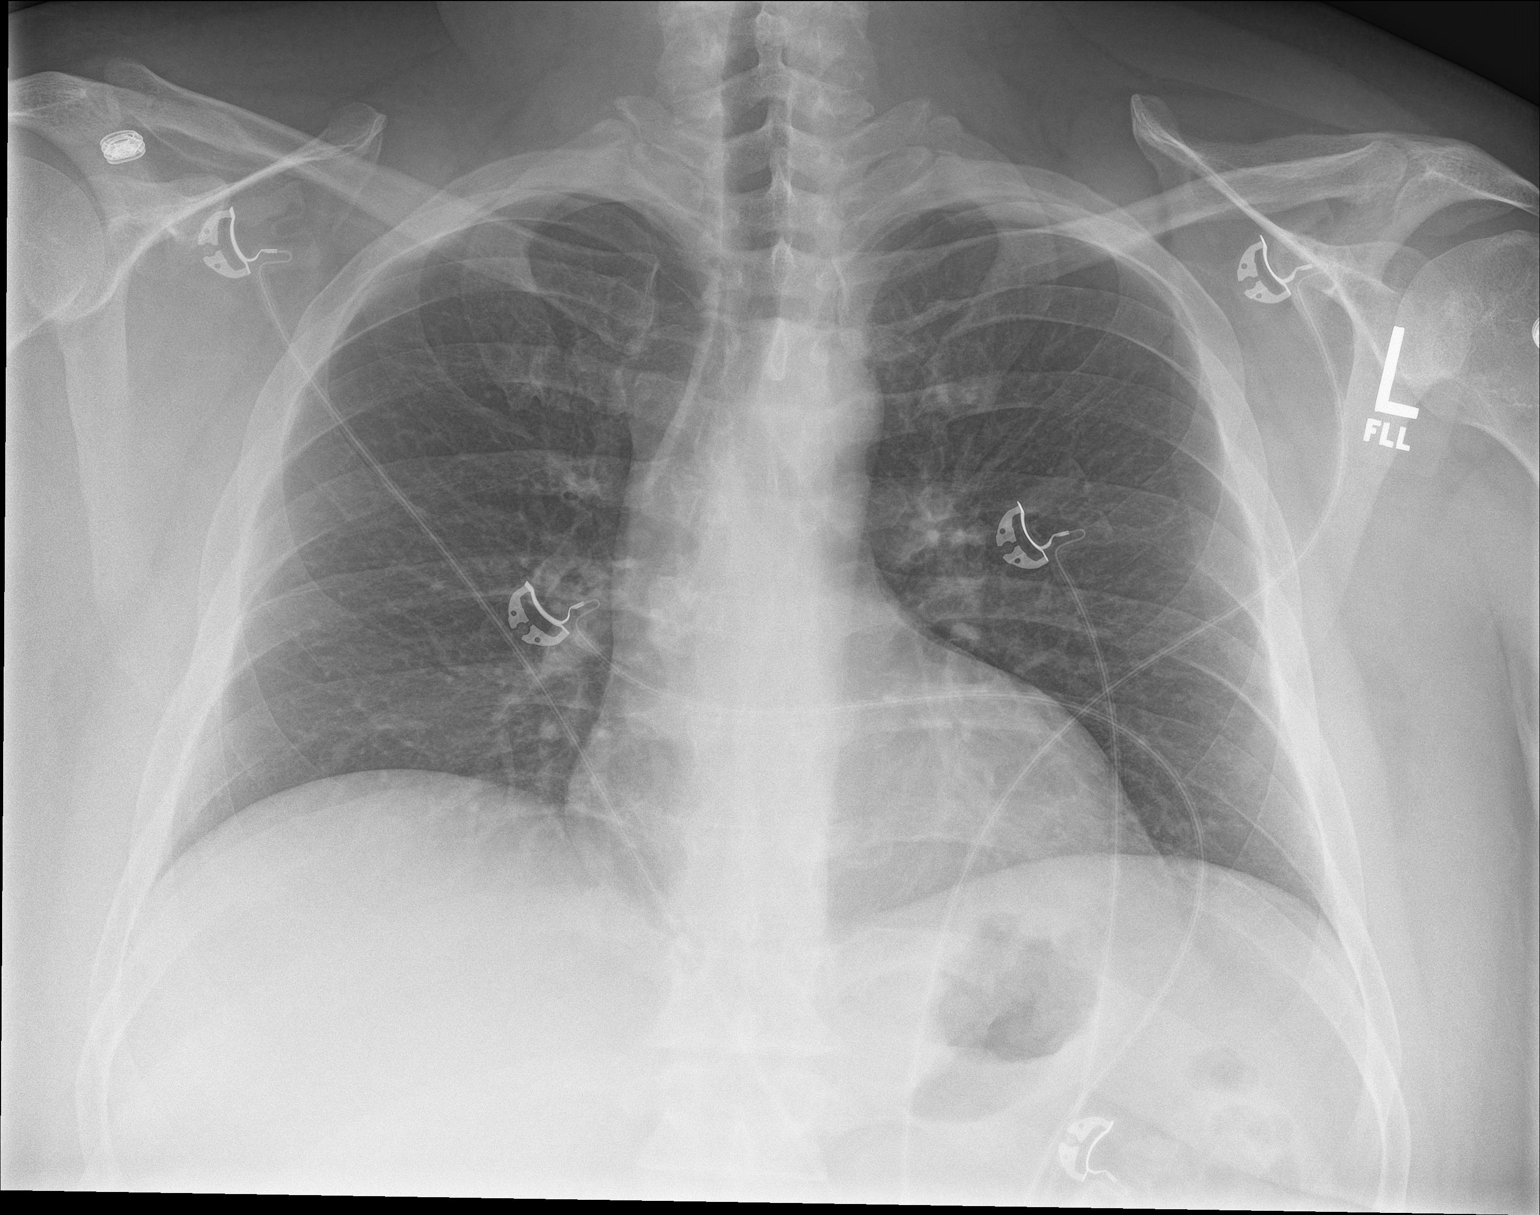

[chest lat]
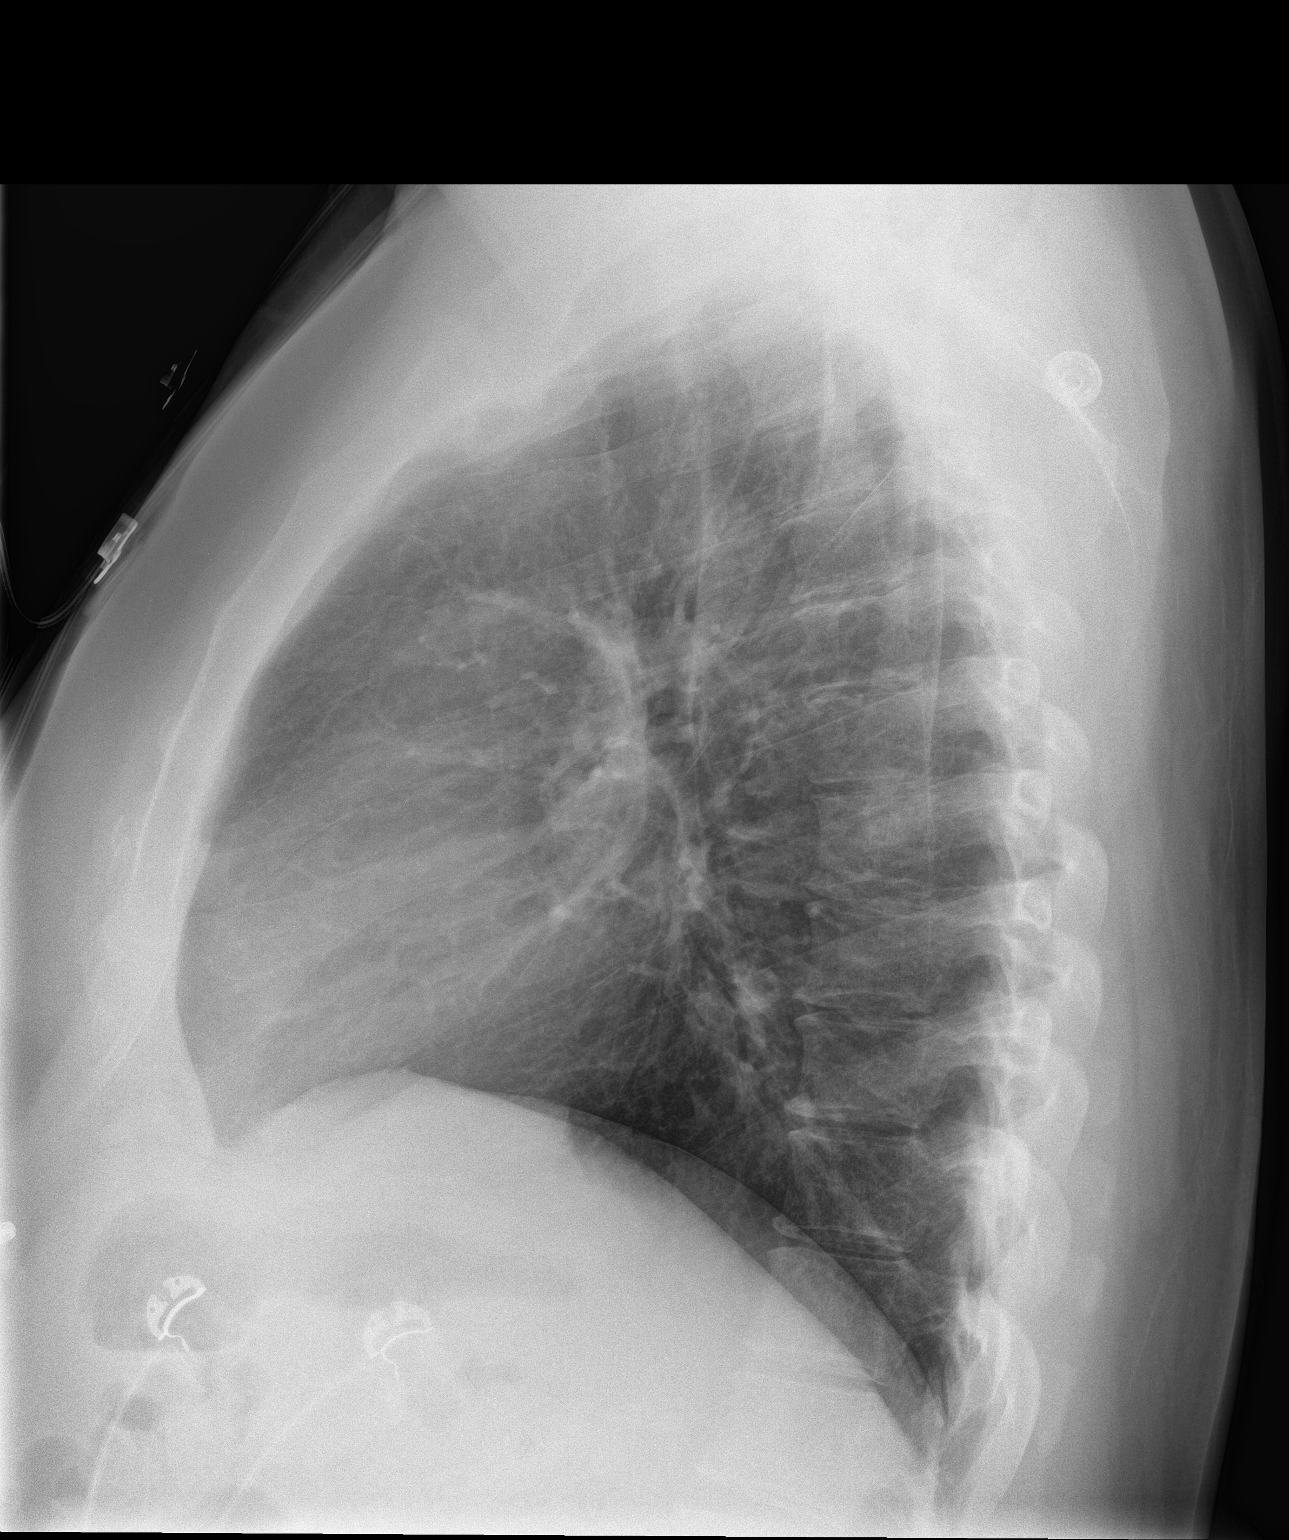

[2 of 2 positions shown; findings below may reference images not displayed]

FINDINGS: The heart size and mediastinal contours are within normal limits.
Both lungs are clear. The visualized skeletal structures are
unremarkable.
IMPRESSION: Negative.  No active cardiopulmonary disease.

## 2018-03-23 DIAGNOSIS — K1120 Sialoadenitis, unspecified: Secondary | ICD-10-CM | POA: Diagnosis not present

## 2018-03-23 DIAGNOSIS — G43909 Migraine, unspecified, not intractable, without status migrainosus: Secondary | ICD-10-CM | POA: Diagnosis not present

## 2018-03-23 DIAGNOSIS — Z681 Body mass index (BMI) 19 or less, adult: Secondary | ICD-10-CM | POA: Diagnosis not present

## 2018-08-31 DIAGNOSIS — R52 Pain, unspecified: Secondary | ICD-10-CM | POA: Diagnosis not present

## 2018-08-31 DIAGNOSIS — J0100 Acute maxillary sinusitis, unspecified: Secondary | ICD-10-CM | POA: Diagnosis not present

## 2018-08-31 DIAGNOSIS — Z6839 Body mass index (BMI) 39.0-39.9, adult: Secondary | ICD-10-CM | POA: Diagnosis not present

## 2019-03-07 DIAGNOSIS — G4733 Obstructive sleep apnea (adult) (pediatric): Secondary | ICD-10-CM | POA: Diagnosis not present

## 2020-02-22 DIAGNOSIS — R0781 Pleurodynia: Secondary | ICD-10-CM | POA: Diagnosis not present

## 2020-02-22 DIAGNOSIS — E162 Hypoglycemia, unspecified: Secondary | ICD-10-CM | POA: Diagnosis not present

## 2020-02-22 DIAGNOSIS — Z13220 Encounter for screening for lipoid disorders: Secondary | ICD-10-CM | POA: Diagnosis not present

## 2020-02-22 DIAGNOSIS — Z0000 Encounter for general adult medical examination without abnormal findings: Secondary | ICD-10-CM | POA: Diagnosis not present

## 2020-02-22 DIAGNOSIS — Z6839 Body mass index (BMI) 39.0-39.9, adult: Secondary | ICD-10-CM | POA: Diagnosis not present

## 2020-02-29 DIAGNOSIS — R079 Chest pain, unspecified: Secondary | ICD-10-CM | POA: Diagnosis not present

## 2020-02-29 DIAGNOSIS — R0781 Pleurodynia: Secondary | ICD-10-CM | POA: Diagnosis not present

## 2021-09-21 ENCOUNTER — Encounter: Payer: Self-pay | Admitting: Urology

## 2021-09-21 ENCOUNTER — Ambulatory Visit (INDEPENDENT_AMBULATORY_CARE_PROVIDER_SITE_OTHER): Payer: BLUE CROSS/BLUE SHIELD | Admitting: Urology

## 2021-09-21 ENCOUNTER — Other Ambulatory Visit: Payer: Self-pay

## 2021-09-21 VITALS — BP 158/82 | HR 90 | Ht 70.0 in | Wt 250.0 lb

## 2021-09-21 DIAGNOSIS — R319 Hematuria, unspecified: Secondary | ICD-10-CM

## 2021-09-21 DIAGNOSIS — M25551 Pain in right hip: Secondary | ICD-10-CM | POA: Diagnosis not present

## 2021-09-21 DIAGNOSIS — Z87440 Personal history of urinary (tract) infections: Secondary | ICD-10-CM | POA: Diagnosis not present

## 2021-09-21 LAB — URINALYSIS, ROUTINE W REFLEX MICROSCOPIC
Bilirubin, UA: NEGATIVE
Glucose, UA: NEGATIVE
Ketones, UA: NEGATIVE
Leukocytes,UA: NEGATIVE
Nitrite, UA: NEGATIVE
Protein,UA: NEGATIVE
Specific Gravity, UA: 1.025 (ref 1.005–1.030)
Urobilinogen, Ur: 0.2 mg/dL (ref 0.2–1.0)
pH, UA: 7 (ref 5.0–7.5)

## 2021-09-21 LAB — MICROSCOPIC EXAMINATION
Bacteria, UA: NONE SEEN
Epithelial Cells (non renal): NONE SEEN /hpf (ref 0–10)
RBC: NONE SEEN /hpf (ref 0–2)
Renal Epithel, UA: NONE SEEN /hpf
WBC, UA: NONE SEEN /hpf (ref 0–5)

## 2021-09-21 NOTE — Progress Notes (Signed)
Urological Symptom Review  Patient is experiencing the following symptoms: Blood in urine   Review of Systems  Gastrointestinal (upper)  : Negative for upper GI symptoms  Gastrointestinal (lower) : Negative for lower GI symptoms  Constitutional : Negative for symptoms  Skin: Negative for skin symptoms  Eyes: Negative for eye symptoms  Ear/Nose/Throat : Negative for Ear/Nose/Throat symptoms  Hematologic/Lymphatic: Negative for Hematologic/Lymphatic symptoms  Cardiovascular : Negative for cardiovascular symptoms  Respiratory : Negative for respiratory symptoms  Endocrine: Negative for endocrine symptoms  Musculoskeletal: Negative for musculoskeletal symptoms  Neurological: Negative for neurological symptoms  Psychologic: Negative for psychiatric symptoms  

## 2021-09-21 NOTE — Progress Notes (Signed)
Assessment: 1. Hip pain, right   2. History of UTI   3. Hematuria - on dipstick     Plan: The patient's records were reviewed including office notes, lab results, and imaging results.  His CT scan from 10/22 did not show any renal or ureteral calculi and no evidence of obstruction.  His urine culture from 10/22 showed small growth of Morganella.  Repeat cultures were negative.  His urinalysis today is unremarkable.  His current right sided hip pain is not urologic in nature.  I discussed that this is likely musculoskeletal. Recommend follow-up in approximately 2 months.  Chief Complaint:  Chief Complaint  Patient presents with   Hematuria    History of Present Illness:  Dylan Gonzales is a 41 y.o. year old male who is seen in consultation from Clemmie Krill, PA-C at [facility] for evaluation of back pain and hematuria. Pt reports he had sxs of severe low back pain with radiation to the posterior scrotum last October which prompted eval with PCP. The pt was told he had blood in his urine and was tx with antibiotics and CT stone study indicated no abnormalities. No stones noted. The pt's pain eased, but continues in the right lateral hip area. Pt denies gross hematuria. Today, pt c/o of right sided low back pain with no radiation. No urinary sxs today. Medical records reviewed and no microscopic UA results noted. Urine cultures as below. Outside Urine cultures: 06/23/21-25-50000 colonies Morganella morganii, resistant to nitrofurantoin, PCNs, cephalosporins 07/25/21 -no growth 08/20/21- no growth  No dysuria or gross hematuria.  No history of stones or UTI's.   IPSS = 0 today.  Past Medical History:  Past Medical History:  Diagnosis Date   Asthma     Past Surgical History:  Past Surgical History:  Procedure Laterality Date   LATERAL COLLATERAL LIGAMENT REPAIR, KNEE Left     Allergies:  Allergies  Allergen Reactions   Other     All pain pills-  vomiting blood,  rapid heart beat -can take tramadol   Sulfonamide Derivatives     Family History:  History reviewed. No pertinent family history.  Social History:  Social History   Tobacco Use   Smoking status: Former    Packs/day: 1.00    Years: 15.00    Pack years: 15.00    Types: Cigarettes    Quit date: 10/08/2013    Years since quitting: 7.9   Smokeless tobacco: Never  Substance Use Topics   Alcohol use: No   Drug use: No    Review of symptoms:  Constitutional:  Negative for unexplained weight loss, night sweats, fever, chills ENT:  Negative for nose bleeds, sinus pain, painful swallowing CV:  Negative for chest pain, shortness of breath, exercise intolerance, palpitations, loss of consciousness Resp:  Negative for cough, wheezing, shortness of breath GI:  Negative for nausea, vomiting, diarrhea, bloody stools GU:  Positives noted in HPI; otherwise negative for gross hematuria, dysuria, urinary incontinence Neuro:  Negative for seizures, poor balance, limb weakness, slurred speech Psych:  Negative for lack of energy, depression, anxiety Endocrine:  Negative for polydipsia, polyuria, symptoms of hypoglycemia (dizziness, hunger, sweating) Hematologic:  Negative for anemia, purpura, petechia, prolonged or excessive bleeding, use of anticoagulants  Allergic:  Negative for difficulty breathing or choking as a result of exposure to anything; no shellfish allergy; no allergic response (rash/itch) to materials, foods  Physical exam: BP (!) 158/82 (BP Location: Left Arm)    Pulse 90  Ht 5\' 10"  (1.778 m)    Wt 250 lb (113.4 kg)    BMI 35.87 kg/m  GENERAL APPEARANCE:  Well appearing, well developed, well nourished, NAD HEENT: Atraumatic, Normocephalic, trachea midline NECK: Supple  LUNGS: Clear to auscultation bilaterally. HEART: Regular Rate and Rhythm without murmurs, gallops, or rubs. ABDOMEN: Soft, non-tender, No Masses. EXTREMITIES: Moves all extremities well.  Without clubbing,  cyanosis, or edema. NEUROLOGIC:  Alert and oriented x 3, normal gait, CN II-XII grossly intact.  MENTAL STATUS:  Appropriate. BACK: Tender soft tissues Right low back and lateral hip.  No CVAT SKIN:  Warm, dry and intact.   GU: Penis:  uncircumcised Meatus: Normal Scrotum: no erythema or edema; small sebaceous cyst left scrotum Testis: normal without masses bilateral Epididymis: normal   Results: U/A dipstick: trace blood

## 2021-10-22 ENCOUNTER — Ambulatory Visit: Payer: BLUE CROSS/BLUE SHIELD | Admitting: Urology

## 2021-11-19 ENCOUNTER — Ambulatory Visit: Payer: BLUE CROSS/BLUE SHIELD | Admitting: Urology

## 2022-10-11 ENCOUNTER — Ambulatory Visit: Payer: BC Managed Care – PPO | Admitting: Urology

## 2022-10-11 VITALS — BP 169/107 | HR 96 | Ht 70.0 in | Wt 250.0 lb

## 2022-10-11 DIAGNOSIS — R3129 Other microscopic hematuria: Secondary | ICD-10-CM | POA: Diagnosis not present

## 2022-10-11 DIAGNOSIS — E291 Testicular hypofunction: Secondary | ICD-10-CM

## 2022-10-11 DIAGNOSIS — R7989 Other specified abnormal findings of blood chemistry: Secondary | ICD-10-CM

## 2022-10-11 LAB — URINALYSIS, ROUTINE W REFLEX MICROSCOPIC
Bilirubin, UA: NEGATIVE
Glucose, UA: NEGATIVE
Ketones, UA: NEGATIVE
Leukocytes,UA: NEGATIVE
Nitrite, UA: NEGATIVE
Protein,UA: NEGATIVE
Specific Gravity, UA: 1.025 (ref 1.005–1.030)
Urobilinogen, Ur: 0.2 mg/dL (ref 0.2–1.0)
pH, UA: 5.5 (ref 5.0–7.5)

## 2022-10-11 LAB — MICROSCOPIC EXAMINATION: Bacteria, UA: NONE SEEN

## 2022-10-11 NOTE — Progress Notes (Signed)
10/11/2022 3:00 PM   Dylan Gonzales 03-20-1981 [id number]  Referring provider: Rosalee Kaufman, PA-C [place],  [place] [address]  No chief complaint on file.   HPI:  New pt -   1) low testosterone - his Dec 2023 T was 197, hgb 14.4, cr 0.9. He has a good libido. He has good erections and normal ejaculation. He has kids - one of his own. Two step. He has gained weight. His energy is low. He has OSA. He uses a CPAP.  No GU meds or surgery. Never had T replacement.    2) MH - no gross hematuria. UA today with 3-10 rbc. CT scan from 10/22 did not show any renal or ureteral calculi and no evidence of obstruction. He has constipation and left flank or low back pain last couple of days. Back pain is new. Hasn't taken anything. He has occasional frequency. He has a good stream. AUASS = 3.   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    PMH: Past Medical History:  Diagnosis Date   Asthma     Surgical History: Past Surgical History:  Procedure Laterality Date   LATERAL COLLATERAL LIGAMENT REPAIR, KNEE Left     Home Medications:  Allergies as of 10/11/2022       Reactions   Other    All pain pills-  vomiting blood, rapid heart beat -can take tramadol   Sulfonamide Derivatives         Medication List        Accurate as of October 11, 2022  3:00 PM. If you have any questions, ask your nurse or doctor.          albuterol 108 (90 Base) MCG/ACT inhaler Commonly known as: VENTOLIN HFA Inhale 1-2 puffs into the lungs every 6 (six) hours as needed for wheezing or shortness of breath.   cephALEXin 500 MG capsule Commonly known as: KEFLEX Take 500 mg by mouth 4 (four) times daily.   ibuprofen 800 MG tablet Commonly known as: ADVIL Take 1 tablet by mouth 2 (two) times daily as needed for pain.   pantoprazole 20 MG tablet Commonly known as: PROTONIX Take 1 tablet (20 mg total) by mouth daily.   SUMAtriptan 100 MG tablet Commonly known as:  IMITREX Take 1 tablet by mouth daily as needed for migraine.        Allergies:  Allergies  Allergen Reactions   Other     All pain pills-  vomiting blood, rapid heart beat -can take tramadol   Sulfonamide Derivatives     Family History: No family history on file.  Social History:  reports that he quit smoking about 9 years ago. His smoking use included cigarettes. He has a 15.00 pack-year smoking history. He has never used smokeless tobacco. He reports that he does not drink alcohol and does not use drugs.   Physical Exam: There were no vitals taken for this visit.  Constitutional:  Alert and oriented, No acute distress. HEENT: [place] AT, moist mucus membranes.  Trachea midline, no masses. Cardiovascular: No clubbing, cyanosis, or edema. Respiratory: Normal respiratory effort, no increased work of breathing. GI: Abdomen is soft, nontender, nondistended, no abdominal masses GU: No CVA tenderness Skin: No rashes, bruises or suspicious lesions. Neurologic: Grossly intact, no focal deficits, moving all 4 extremities. Psychiatric: Normal mood and affect. GU: GU: Penis circumcised, normal foreskin, testicles descended bilaterally and palpably normal, bilateral epididymis palpably normal, scrotum normal with a 10  mm left sebaceous cyst  DRE: Prostate 20 g, smooth without hard area or nodule   Laboratory Data: Lab Results  Component Value Date   WBC 7.1 11/24/2017   HGB 14.0 11/24/2017   HCT 43.3 11/24/2017   MCV 91.5 11/24/2017   PLT 243 11/24/2017    Lab Results  Component Value Date   CREATININE 0.79 11/24/2017    Urinalysis    Component Value Date/Time   APPEARANCEUR Clear 09/21/2021 1628   GLUCOSEU Negative 09/21/2021 1628   BILIRUBINUR Negative 09/21/2021 1628   PROTEINUR Negative 09/21/2021 1628   NITRITE Negative 09/21/2021 1628   LEUKOCYTESUR Negative 09/21/2021 1628    Lab Results  Component Value Date   LABMICR See below: 09/21/2021   WBCUA None seen  09/21/2021   LABEPIT None seen 09/21/2021   BACTERIA None seen 09/21/2021     Assessment & Plan:    Low T - check a T, bioT, prolactin and LH one AM.   MH - check CT and cystoscopy.   No follow-ups on file.  Festus Aloe, MD  [facility]  [address] [place], [place] [address] [phone]

## 2022-10-13 ENCOUNTER — Encounter (HOSPITAL_COMMUNITY): Payer: Self-pay

## 2022-10-13 ENCOUNTER — Emergency Department (HOSPITAL_COMMUNITY)
Admission: EM | Admit: 2022-10-13 | Discharge: 2022-10-13 | Disposition: A | Discharge status: Home / Self Care | Payer: BC Managed Care – PPO | Attending: Emergency Medicine | Admitting: Emergency Medicine

## 2022-10-13 ENCOUNTER — Other Ambulatory Visit: Payer: Self-pay

## 2022-10-13 ENCOUNTER — Emergency Department (HOSPITAL_COMMUNITY): Payer: BC Managed Care – PPO

## 2022-10-13 DIAGNOSIS — R109 Unspecified abdominal pain: Secondary | ICD-10-CM | POA: Diagnosis not present

## 2022-10-13 LAB — URINALYSIS, MICROSCOPIC (REFLEX): Bacteria, UA: NONE SEEN

## 2022-10-13 LAB — URINALYSIS, ROUTINE W REFLEX MICROSCOPIC
Bilirubin Urine: NEGATIVE
Glucose, UA: NEGATIVE mg/dL
Ketones, ur: NEGATIVE mg/dL
Leukocytes,Ua: NEGATIVE
Nitrite: NEGATIVE
Protein, ur: NEGATIVE mg/dL
Specific Gravity, Urine: 1.02 (ref 1.005–1.030)
pH: 6.5 (ref 5.0–8.0)

## 2022-10-13 LAB — CBC WITH DIFFERENTIAL/PLATELET
Abs Immature Granulocytes: 0.04 10*3/uL (ref 0.00–0.07)
Basophils Absolute: 0.1 10*3/uL (ref 0.0–0.1)
Basophils Relative: 1 %
Eosinophils Absolute: 0.2 10*3/uL (ref 0.0–0.5)
Eosinophils Relative: 2 %
HCT: 45.8 % (ref 39.0–52.0)
Hemoglobin: 15.4 g/dL (ref 13.0–17.0)
Immature Granulocytes: 0 %
Lymphocytes Relative: 22 %
Lymphs Abs: 2.4 10*3/uL (ref 0.7–4.0)
MCH: 29.9 pg (ref 26.0–34.0)
MCHC: 33.6 g/dL (ref 30.0–36.0)
MCV: 88.9 fL (ref 80.0–100.0)
Monocytes Absolute: 0.6 10*3/uL (ref 0.1–1.0)
Monocytes Relative: 5 %
Neutro Abs: 7.6 10*3/uL (ref 1.7–7.7)
Neutrophils Relative %: 70 %
Platelets: 302 10*3/uL (ref 150–400)
RBC: 5.15 MIL/uL (ref 4.22–5.81)
RDW: 12.5 % (ref 11.5–15.5)
WBC: 10.9 10*3/uL — ABNORMAL HIGH (ref 4.0–10.5)
nRBC: 0 % (ref 0.0–0.2)

## 2022-10-13 LAB — COMPREHENSIVE METABOLIC PANEL
ALT: 42 U/L (ref 0–44)
AST: 26 U/L (ref 15–41)
Albumin: 4.3 g/dL (ref 3.5–5.0)
Alkaline Phosphatase: 106 U/L (ref 38–126)
Anion gap: 9 (ref 5–15)
BUN: 15 mg/dL (ref 6–20)
CO2: 26 mmol/L (ref 22–32)
Calcium: 9.3 mg/dL (ref 8.9–10.3)
Chloride: 101 mmol/L (ref 98–111)
Creatinine, Ser: 0.75 mg/dL (ref 0.61–1.24)
GFR, Estimated: >60 mL/min (ref >60)
Glucose, Bld: 88 mg/dL (ref 70–99)
Potassium: 4 mmol/L (ref 3.5–5.1)
Sodium: 136 mmol/L (ref 135–145)
Total Bilirubin: 0.9 mg/dL (ref 0.3–1.2)
Total Protein: 8.4 g/dL — ABNORMAL HIGH (ref 6.5–8.1)

## 2022-10-13 LAB — LIPASE, BLOOD: Lipase: 37 U/L (ref 11–51)

## 2022-10-13 MED ORDER — IBUPROFEN 800 MG PO TABS
800.0000 mg | ORAL_TABLET | Freq: Once | ORAL | Status: AC
  Administered 2022-10-13: 800 mg via ORAL
  Filled 2022-10-13: qty 1

## 2022-10-13 MED ORDER — KETOROLAC TROMETHAMINE 30 MG/ML IJ SOLN: 30.0000 mg | Freq: Once | INTRAMUSCULAR | Status: DC

## 2022-10-13 NOTE — ED Provider Triage Note (Signed)
Emergency Medicine Provider Triage Evaluation Note  Dylan Gonzales , a 42 y.o. male  was evaluated in triage.  Pt complains of left flank pain.  Started on Friday.  Pain is located in the left flank radiates to the front of his abdomen at times.  It is sharp.  Was seen by urologist on Monday and was found to have hematuria.  Was advised to be evaluated here in the ED.  Denies urinary changes otherwise.  No fever or chills.  No emesis or bowel changes.  Review of Systems  Positive: See above Negative: See above  Physical Exam  BP (!) 156/100 (BP Location: Right Arm)   Pulse 85   Temp 98.3 F (36.8 C) (Oral)   Resp 18   Ht 5\' 10"  (1.778 m)   Wt 128.4 kg   SpO2 100%   BMI 40.61 kg/m  Gen:   Awake, no distress   Resp:  Normal effort  MSK:   Moves extremities without difficulty  Other:    Medical Decision Making  Medically screening exam initiated at 1:08 PM.  Appropriate orders placed.  Dylan Gonzales was informed that the remainder of the evaluation will be completed by another provider, this initial triage assessment does not replace that evaluation, and the importance of remaining in the ED until their evaluation is complete.  Work up started   Harriet Pho, PA-C 10/13/22 1311

## 2022-10-13 NOTE — ED Provider Notes (Signed)
[facility] Provider Note   CSN: [id number] Arrival date & time: 10/13/22  1039     History  Chief Complaint  Patient presents with   Flank Pain   HPI BRAHM BARBEAU is a 42 y.o. male with asthma presenting for left flank pain.  Started on Friday.  Pain is located in the left flank radiates to the front of his abdomen at times.  It is sharp.  Was seen by urologist on Monday and was found to have hematuria.  Was advised to be evaluated here in the ED.  Denies urinary changes otherwise.  No fever or chills.  No emesis or bowel changes.    Flank Pain       Home Medications Prior to Admission medications   Medication Sig Start Date End Date Taking? Authorizing Provider  albuterol (PROVENTIL HFA;VENTOLIN HFA) 108 (90 Base) MCG/ACT inhaler Inhale 1-2 puffs into the lungs every 6 (six) hours as needed for wheezing or shortness of breath.    [provider]  cephALEXin (KEFLEX) 500 MG capsule Take 500 mg by mouth 4 (four) times daily. 08/12/21   [provider]  ibuprofen (ADVIL,MOTRIN) 800 MG tablet Take 1 tablet by mouth 2 (two) times daily as needed for pain. 11/02/17   [provider]  pantoprazole (PROTONIX) 20 MG tablet Take 1 tablet (20 mg total) by mouth daily. 11/24/17   Milton Ferguson, MD  SUMAtriptan (IMITREX) 100 MG tablet Take 1 tablet by mouth daily as needed for migraine. 11/02/17   [provider]      Allergies    Other and Sulfonamide derivatives    Review of Systems   Review of Systems  Genitourinary:  Positive for flank pain.    Physical Exam   Vitals:   10/13/22 1527 10/13/22 1529  BP: 126/78   Pulse:    Resp: 18   Temp:  98.8 F (37.1 C)  SpO2:      CONSTITUTIONAL:  well-appearing, NAD NEURO:  Alert and oriented x 3, CN 3-12 grossly intact EYES:  eyes equal and reactive ENT/NECK:  Supple, no stridor  CARDIO:  regular rate and rhythm, appears well-perfused  PULM:  No  respiratory distress, CTAB GI/GU:  non-distended, soft, left cva tenderness MSK/SPINE:  No gross deformities, no edema, moves all extremities  SKIN:  no rash, atraumatic   *Additional and/or pertinent findings included in MDM below    ED Results / Procedures / Treatments   Labs (all labs ordered are listed, but only abnormal results are displayed) Labs Reviewed  COMPREHENSIVE METABOLIC PANEL - Abnormal; Notable for the following components:      Result Value   Total Protein 8.4 (*)    All other components within normal limits  CBC WITH DIFFERENTIAL/PLATELET - Abnormal; Notable for the following components:   WBC 10.9 (*)    All other components within normal limits  URINALYSIS, ROUTINE W REFLEX MICROSCOPIC - Abnormal; Notable for the following components:   Hgb urine dipstick TRACE (*)    All other components within normal limits  LIPASE, BLOOD  URINALYSIS, MICROSCOPIC (REFLEX)    EKG None  Radiology CT Renal Stone Study  Result Date: 10/13/2022 CLINICAL DATA:  Left flank pain with microscopic hematuria. EXAM: CT ABDOMEN AND PELVIS WITHOUT CONTRAST TECHNIQUE: Multidetector CT imaging of the abdomen and pelvis was performed following the standard protocol without IV contrast. RADIATION DOSE REDUCTION: This exam was performed according to the departmental dose-optimization program which includes  automated exposure control, adjustment of the mA and/or kV according to patient size and/or use of iterative reconstruction technique. COMPARISON:  Report from CT June 18, 2021 however no corresponding comparison imaging available at time dictation. FINDINGS: Lower chest: No acute abnormality.  Tiny hiatal hernia. Hepatobiliary: Diffuse hepatic steatosis. Gallbladder is unremarkable. No biliary ductal dilation. Pancreas: No pancreatic ductal dilation or evidence of acute inflammation. Spleen: No splenomegaly. Adrenals/Urinary Tract: Bilateral adrenal glands appear normal. Hypodense 2.8 cm  lesion in the left renal sinus measures fluid density compatible with a cyst and considered benign requiring no independent imaging follow-up. Hydronephrosis. No renal, ureteral or bladder calculi. Mild wall thickening of an incompletely distended urinary bladder. Stomach/Bowel: Stomach is unremarkable for degree of distension. No pathologic dilation of small or large bowel. The appendix and terminal ileum appear normal. Scattered colonic diverticulosis without findings of acute diverticulitis. Vascular/Lymphatic: Normal caliber abdominal aorta. No pathologically enlarged abdominal or pelvic lymph nodes. Reproductive: Prostate is unremarkable. Other: No significant abdominopelvic free fluid. Musculoskeletal: Multilevel degenerative changes spine. IMPRESSION: 1. No hydronephrosis. No renal, ureteral or bladder calculi. 2. Mild wall thickening of an incompletely distended urinary bladder. Correlate with urinalysis to exclude cystitis. 3. Diffuse hepatic steatosis. 4. Scattered colonic diverticulosis without findings of acute diverticulitis. Electronically Signed   By: Dahlia Bailiff M.D.   On: 10/13/2022 14:31    Procedures Procedures    Medications Ordered in ED Medications  ibuprofen (ADVIL) tablet 800 mg (has no administration in time range)    ED Course/ Medical Decision Making/ A&P                             Medical Decision Making Amount and/or Complexity of Data Reviewed Labs: ordered. Radiology: ordered.  Risk Prescription drug management.   Initial Impression and Ddx 42 year old male who is well-appearing and HD stable presenting for left-sided flank pain.  Left CVA tenderness noted on exam.  Differential diagnosis for this complaint includes nephrolithiasis, pyelonephritis, diverticulitis, and soft tissue injury. Patient PMH that increases complexity of ED encounter:  none  Interpretation of Diagnostics I independent reviewed and interpreted the labs as followed: Trace  hematuria  - I independently visualized the following imaging with scope of interpretation limited to determining acute life threatening conditions related to emergency care: CT renal stone, which revealed bladder wall thickening  Patient Reassessment and Ultimate Disposition/Management Primary concern was nephrolithiasis versus pyelonephritis.  Fortunately CT scan was unremarkable.  Did reveal some concern for possible cystitis.  UA however was unremarkable with only trace hematuria.  Patient also denies urinary symptoms so unlikely that he has an ongoing infection.  Treated pain with ibuprofen.  Advised him to follow-up with his PCP.  Patient management required discussion with the following services or consulting groups:  None  Complexity of Problems Addressed Acute complicated illness or Injury  Additional Data Reviewed and Analyzed Further history obtained from: None  Patient Encounter Risk Assessment None         Final Clinical Impression(s) / ED Diagnoses Final diagnoses:  Flank pain    Rx / DC Orders ED Discharge Orders     None         Harriet Pho, PA-C 10/13/22 1714    Isla Pence, MD 10/14/22 [phone]

## 2022-10-13 NOTE — ED Triage Notes (Signed)
Pt reports left side flank pain since Friday, saw urology on Monday and was told her had blood in his urine and they would try to schedule a CT scan but he is hurting to bad to wait.

## 2022-10-13 NOTE — Discharge Instructions (Signed)
Evaluation of your flank pain is overall reassuring.  CT scan did not reveal any evidence of a kidney stone.  Also urinalysis was not concerning for infection.  If you do start to have painful urination, malodorous urine or abnormal penile discharge or any other concerning finding please return to the emergency department for further evaluation.  Otherwise you may follow-up with your if your symptoms persist.

## 2022-10-15 ENCOUNTER — Other Ambulatory Visit: Payer: BLUE CROSS/BLUE SHIELD

## 2022-10-18 ENCOUNTER — Ambulatory Visit (INDEPENDENT_AMBULATORY_CARE_PROVIDER_SITE_OTHER): Payer: BC Managed Care – PPO | Admitting: Urology

## 2022-10-18 VITALS — BP 169/106 | HR 97 | Ht 70.0 in | Wt 283.0 lb

## 2022-10-18 DIAGNOSIS — R3129 Other microscopic hematuria: Secondary | ICD-10-CM

## 2022-10-18 DIAGNOSIS — E291 Testicular hypofunction: Secondary | ICD-10-CM

## 2022-10-18 LAB — URINALYSIS, ROUTINE W REFLEX MICROSCOPIC
Bilirubin, UA: NEGATIVE
Glucose, UA: NEGATIVE
Ketones, UA: NEGATIVE
Leukocytes,UA: NEGATIVE
Nitrite, UA: NEGATIVE
Protein,UA: NEGATIVE
Specific Gravity, UA: 1.025 (ref 1.005–1.030)
Urobilinogen, Ur: 0.2 mg/dL (ref 0.2–1.0)
pH, UA: 6 (ref 5.0–7.5)

## 2022-10-18 LAB — MICROSCOPIC EXAMINATION: Bacteria, UA: NONE SEEN

## 2022-10-18 MED ORDER — CIPROFLOXACIN HCL 500 MG PO TABS
500.0000 mg | ORAL_TABLET | Freq: Once | ORAL | Status: AC
  Administered 2022-10-18: 500 mg via ORAL

## 2022-10-18 NOTE — Progress Notes (Signed)
  Dylan Gonzales  10/18/22  CC: No chief complaint on file.   HPI:  F/u -    1) low testosterone - his Dec 2023 T was 197, hgb 14.4, cr 0.9. He has a good libido. He has good erections and normal ejaculation. He has kids - one of his own. Two step. He has gained weight. His energy is low. He has OSA. He uses a CPAP.  No GU meds or surgery. Never had T replacement.      2) MH - no gross hematuria. UA Jan 2024 with 3-10 rbc. CT scan from 10/22 did not show any renal or ureteral calculi and no evidence of obstruction. He has constipation and left flank or low back pain in Jan 2023. Back pain is new. Hasn't taken anything. He has occasional frequency. He has a good stream. AUASS = 3.    He returns and went to ED for left flank pain 10/13/2022. CT benign with a 2.7 cm endophytic left renal sinus cyst. UA clear.   Now the pain is also on the right (flank/back) and radiates around. No SP pressure and no freq/urge. AUASS - 1.   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   Blood pressure (!) 169/106, pulse 97, height 5\' 10"  (1.778 m), weight 283 lb (128.4 kg). NED. A&Ox3.   No respiratory distress   Abd soft, NT, ND Normal phallus with bilateral descended testicles  Cystoscopy Procedure Note  Patient identification was confirmed, informed consent was obtained, and patient was prepped using Betadine solution.  Lidocaine jelly was administered per urethral meatus.     Pre-Procedure: - Inspection reveals a normal caliber ureteral meatus.  Procedure: The flexible cystoscope was introduced without difficulty - No urethral strictures/lesions are present. - normal prostate without obstruction - normal bladder neck - Bilateral ureteral orifices identified - Bladder mucosa  reveals no ulcers, tumors, or lesions - No bladder stones - No trabeculation  Retroflexion shows normal bladder and BN.    Post-Procedure: - Patient tolerated the procedure well  Assessment/ Plan:  MH - benign  eval. No GU cause of patients back pain.   Low T - check T and f/u to review.   No follow-ups on file.  Festus Aloe, MD

## 2022-10-19 LAB — TESTOSTERONE: Testosterone: 273 ng/dL (ref 264–916)

## 2022-11-29 ENCOUNTER — Ambulatory Visit: Payer: BC Managed Care – PPO | Admitting: Urology
